# Patient Record
Sex: Male | Born: 1970 | Race: Black or African American | Hispanic: No | Marital: Single | Smoking: Never smoker
Health system: Southern US, Community
[De-identification: ages and names within clinical notes are randomized; demographics above are authoritative.]

## PROBLEM LIST (undated history)

## (undated) DIAGNOSIS — C9590 Leukemia, unspecified not having achieved remission: Secondary | ICD-10-CM

## (undated) DIAGNOSIS — N289 Disorder of kidney and ureter, unspecified: Secondary | ICD-10-CM

## (undated) DIAGNOSIS — N189 Chronic kidney disease, unspecified: Secondary | ICD-10-CM

## (undated) DIAGNOSIS — E119 Type 2 diabetes mellitus without complications: Secondary | ICD-10-CM

## (undated) DIAGNOSIS — I10 Essential (primary) hypertension: Secondary | ICD-10-CM

## (undated) DIAGNOSIS — I2510 Atherosclerotic heart disease of native coronary artery without angina pectoris: Secondary | ICD-10-CM

## (undated) DIAGNOSIS — I222 Subsequent non-ST elevation (NSTEMI) myocardial infarction: Secondary | ICD-10-CM

## (undated) HISTORY — PX: SHOULDER SURGERY: SHX246

## (undated) HISTORY — DX: Chronic kidney disease, unspecified: N18.9

## (undated) HISTORY — DX: Disorder of kidney and ureter, unspecified: N28.9

## (undated) HISTORY — DX: Atherosclerotic heart disease of native coronary artery without angina pectoris: I25.10

## (undated) HISTORY — PX: CARDIAC CATHETERIZATION: SHX172

## (undated) HISTORY — DX: Leukemia, unspecified not having achieved remission: C95.90

## (undated) HISTORY — PX: OTHER SURGICAL HISTORY: SHX169

## (undated) HISTORY — DX: Subsequent non-ST elevation (NSTEMI) myocardial infarction: I22.2

## (undated) HISTORY — DX: Essential (primary) hypertension: I10

## (undated) HISTORY — DX: Type 2 diabetes mellitus without complications: E11.9

---

## 2006-04-25 ENCOUNTER — Ambulatory Visit (HOSPITAL_COMMUNITY): Admission: RE | Admit: 2006-04-25 | Discharge: 2006-04-25 | Payer: Self-pay | Admitting: Family Medicine

## 2006-07-23 ENCOUNTER — Inpatient Hospital Stay (HOSPITAL_COMMUNITY): Admission: EM | Admit: 2006-07-23 | Discharge: 2006-07-24 | Payer: Self-pay | Admitting: Specialist

## 2006-07-23 ENCOUNTER — Other Ambulatory Visit: Payer: Self-pay | Admitting: Specialist

## 2007-05-02 ENCOUNTER — Emergency Department (HOSPITAL_COMMUNITY): Admission: EM | Admit: 2007-05-02 | Discharge: 2007-05-03 | Payer: Self-pay | Admitting: Emergency Medicine
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
## 2010-06-05 NOTE — Op Note (Signed)
NAME:  Dean Stone, Dean Stone NO.:  [id number]   MEDICAL RECORD NO.:  [id number]          PATIENT TYPE:  INP   LOCATION:  1528                         FACILITY:  [facility]   PHYSICIAN:  Jene Every, M.D.    DATE OF BIRTH:  06-10-1970   DATE OF PROCEDURE:  07/23/2006  DATE OF DISCHARGE:                               OPERATIVE REPORT   PREOPERATIVE DIAGNOSIS:  Rotator cuff tear, AC arthrosis and pendulum  syndrome.   POSTOPERATIVE DIAGNOSIS:  Rotator cuff tear, AC arthrosis and pendulum  syndrome.   PROCEDURE:  1. Open rotator cuff repair.  2. Subacromial decompression.  3. Acromioplasty.  4. Distal clavicle resection.   ANESTHESIA:  General.   ASSISTANT:  Roma Schanz, P.A.   INDICATIONS FOR PROCEDURE:  A 40 year old with persistent shoulder pain.  MRI indicating near full-thickness tear of the rotator cuff, AC  arthrosis, and impingement refractory to conservative treatment.  Operative intervention was indicated for repair of the torn tendon and  distal clavicle resection.  Risks and benefits discussed including  infection, suboptimal range of motion, recurrent tear, need for  revision, anesthetic complications etc.   DESCRIPTION OF PROCEDURE:  The patient supine in beach-chair position  after induction of adequate anesthesia, 2 grams Kefzol, the right  shoulder and upper extremity prepped and draped in the usual sterile  fashion.  Incision was made over the anterolateral aspect of the  acromion, cutaneous tissue was dissected.  Electrocautery utilized to  achieve hemostasis, 0.25% Marcaine with epinephrine infiltrated in  subcutaneous tissue. Divided deltotrapezial fascia, incised the capsule  over the distal clavicle, skeletonized the distal clavicle was  subperiosteal elevation, delineated the anterior posterior and inferior  portions of the distal clavicle with a baby Bennett.  Oscillating saw  utilized to remove the distal centimeter of the clavicle  and was found  be severely osteoarthritic.  This was then removed, the spur projecting  inferiorly was removed with 3 mm Kerrison undercutting the inferior  surface of the clavicle.  Clavicle was stable following the resection.  There was ample space between that and acromion. Copiously irrigated  with antibiotic irrigation.  Bone wax placed over the distal clavicle  exposed surface, Gelfoam placed in the defect.  I repaired the capsule  with #1 Vicryl interrupted figure-of-eight sutures.  Next raphe between  the anterolateral heads of the acromion was identified divided 3 cm over  the anterolateral aspect the acromion, subperiosteally elevated the  deltoid insertion off the anterolateral aspect and the anterior medial  aspect of the acromion, protected the tissue, dissected the CA ligament,  performed acromioplasty utilizing oscillating saw and 3 mm Kerrison to a  type 1 acromion.  We found a small accessory full-thickness tear of the  rotator cuff supraspinatus near its insertion.  We then excised and  debrided it, curetted bone beneath it, and repaired side-to-side #1  Vicryl interrupted figure-of-eight suture with excellent closure.  On  exam the remainder of the cuff anteriorly, posteriorly more medially  without additional tear.  Good range of motion without tension on the  repair site.  No impingement. Wound copiously irrigated,  repaired the  Raphe #1 Vicryl interrupted figure-of-eight sutures up into and through  the acromion with excellent repair.  Repaired the deltotrapezial fascia  and subcutaneous tissue with 2-0  Vicryl suture, skin 4-0 Prolene subcuticular wound reinforced Steri-  Strips.  Sterile dressing applied, placed in abduction pillow, extubated  without difficulty and transported to recovery in satisfactory  condition.   The patient tolerated the procedure well with no complications.      Jene Every, M.D.  Electronically Signed     JB/MEDQ  D:   07/23/2006  T:  07/23/2006  Job:  [id number]

## 2010-11-06 LAB — POCT HEMOGLOBIN-HEMACUE
Hemoglobin: 16.1
Operator id: 133231

## 2014-11-25 ENCOUNTER — Encounter: Payer: Self-pay | Admitting: Cardiothoracic Surgery

## 2014-11-25 ENCOUNTER — Encounter: Payer: Self-pay | Admitting: Cardiovascular Disease

## 2014-11-25 ENCOUNTER — Institutional Professional Consult (permissible substitution) (INDEPENDENT_AMBULATORY_CARE_PROVIDER_SITE_OTHER): Payer: Managed Care, Other (non HMO) | Admitting: Cardiothoracic Surgery

## 2014-11-25 ENCOUNTER — Encounter: Payer: Self-pay | Admitting: *Deleted

## 2014-11-25 VITALS — BP 139/89 | HR 87 | Resp 16 | Ht 70.0 in | Wt 260.0 lb

## 2014-11-25 DIAGNOSIS — N189 Chronic kidney disease, unspecified: Secondary | ICD-10-CM

## 2014-11-25 DIAGNOSIS — E1165 Type 2 diabetes mellitus with hyperglycemia: Secondary | ICD-10-CM | POA: Diagnosis not present

## 2014-11-25 DIAGNOSIS — I25119 Atherosclerotic heart disease of native coronary artery with unspecified angina pectoris: Secondary | ICD-10-CM

## 2014-11-25 DIAGNOSIS — IMO0002 Reserved for concepts with insufficient information to code with codable children: Secondary | ICD-10-CM | POA: Insufficient documentation

## 2014-11-25 DIAGNOSIS — E1122 Type 2 diabetes mellitus with diabetic chronic kidney disease: Secondary | ICD-10-CM | POA: Diagnosis not present

## 2014-11-25 DIAGNOSIS — I2510 Atherosclerotic heart disease of native coronary artery without angina pectoris: Secondary | ICD-10-CM | POA: Insufficient documentation

## 2014-11-25 DIAGNOSIS — E1129 Type 2 diabetes mellitus with other diabetic kidney complication: Secondary | ICD-10-CM | POA: Insufficient documentation

## 2014-11-25 MED ORDER — INSULIN GLARGINE 100 UNIT/ML SOLOSTAR PEN: 25.0000 [IU] | PEN_INJECTOR | Freq: Every day | SUBCUTANEOUS | Status: AC

## 2014-11-25 NOTE — Progress Notes (Signed)
PCP is No primary care provider on file. Referring Provider is Ronald Lobo  Chief Complaint  Patient presents with  . Coronary Artery Disease    ECHO and CATH while IP in [place]  patient examined, outside coronary arteriograms from [place] personally reviewed. Echocardiogram images not available but report reviewed.  HPI: The patient is a 44 year old obese diabetic nonsmoker who was admitted to the hospital in [place] with symptoms of heart failure and positive cardiac enzymes. Cardiac catheterization demonstrated moderate-severe three-vessel coronary disease. Baseline creatinine is 1.9 soVgram not performed. Echocardiogram showed EF 45% with mild-moderate MR. The patient developed acute renal failure requiring dialysis after cardiac catheterization. His hemoglobin A1c was 11.5. The patient was recommended to have CABG. The patient preferred to have surgery at home and he returns for surgical evaluation. Is not having anginal symptoms. Unfortunately he was told to stop taking his diabetic medication his blood sugars are running close to 300. His primary care person has gone on maternity leave.  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 He denies recurrent hematologic problems.we'll try to get some old records. Past Medical History  Diagnosis Date  . CAD (coronary artery disease)   . Diabetes ([place])   . Hypertension   . Leukemia ([place])     was treated in his 81's  . Renal insufficiency   . Chronic kidney disease   . Acute non-ST-elevation MI following previous MI [facility])     Past Surgical History   Procedure Laterality Date  . Fracture surgery only    . Cardiac catheterization      11/14/2014-   . Shoulder surgery      FOR BONE SPUR, ARTHRITIS    Family History  Problem Relation Age of Onset  . Stroke Father     Social History Social History  Substance Use Topics  . Smoking status: Never Smoker   . Smokeless tobacco: Never Used  . Alcohol Use: No    Current Outpatient Prescriptions  Medication Sig Dispense Refill  . calcitRIOL (ROCALTROL) 0.25 MCG capsule Take 0.25 mcg by mouth daily.    . carvedilol (COREG) 12.5 MG tablet Take 12.5 mg by mouth 2 (two) times daily with a meal.    . furosemide (LASIX) 40 MG tablet Take 40 mg by mouth 2 (two) times daily.    Marland Kitchen levofloxacin (LEVAQUIN) 250 MG tablet Take 250 mg by mouth daily. X 5 DAYS ENDING 11/26/14    . nitroGLYCERIN (NITROSTAT) 0.4 MG SL tablet Place 0.4 mg under the tongue every 5 (five) minutes as needed for chest pain.     No current facility-administered medications for this visit.    Allergies  Allergen Reactions  . Vancomycin Hives    Review of Systems       The patient denies any penetrating or blunt chest trauma      The patient is right-hand dominant      The patient has mild-moderate varicosities of his left lower leg but no history of DVT       The patient has only 2 teeth left, right maxilla            Review of  Systems :  [ y ] = yes, [  ] = no        General :  Weight gain [   ]    Weight loss  [ yes from fluid loss  ]  Fatigue [  ]  Fever [  ]  Chills  [  ]                                Weakness  [  ]           Cardiac :  Chest pain/ pressure [  ]  Resting SOB [  ] exertional SOB [place]  ]                        Orthopnea [  ]  Pedal edema  [yes improved  ]  Palpitations [  ] Syncope/presyncope [ ]                         Paroxysmal nocturnal dyspnea [  ]        Pulmonary : cough [  ]  wheezing [  ]  Hemoptysis [  ] Sputum [  ] Snoring [  ]                              Pneumothorax [  ]  Sleep  apnea [  ]       GI : Vomiting [  ]  Dysphagia [  ]  Melena  [  ]  Abdominal pain [  ] BRBPR [  ]              Heart burn [  ]  Constipation [  ] Diarrhea  [  ] Colonoscopy [  ]       GU : Hematuria [  ]  Dysuria [  ]  Nocturia [  ] UTI's [  ]       Vascular : Claudication [  ]  Rest pain [  ]  DVT [  ] Vein stripping [  ] leg ulcers [  ]                          TIA [  ] Stroke [  ]  Varicose veins [ mild-moderate left leg involvement ]       NEURO :  Headaches  [  ] Seizures [  ] Vision changes [  ] Paresthesias [  ]       Musculoskeletal :  Arthritis [  ] Gout  [  ]  Back pain [  ]  Joint pain [  ]       Skin :  Rash [  ]  Melanoma [  ]        Heme : Bleeding problems [  ]Clotting Disorders [  ] Anemia [  ]Blood Transfusion [ ]  history of acutemyelo- blastic leukemia at age 45       Endocrine : Diabetes [ yes-A1c 48.5 ] Thyroid Disorder  [  ]       Psych : Depression [  ]  Anxiety [  ]  Psych hospitalizations [  ]  BP 139/89 mmHg  Pulse 87  Resp 16  Ht 5\' 10"  (1.778 m)  Wt 260 lb (117.935 kg)  BMI 37.31 kg/m2  SpO2 98% Physical Exam      Physical Exam  General: obese  AA male in no distress accompanied by wife in the office HEENT: Normocephalic pupils equal , dentition adequate Neck: Supple without JVD, adenopathy, or bruit Chest: Clear to auscultation, symmetrical breath sounds, no rhonchi, no tenderness             or deformity Cardiovascular: Regular rate and rhythm, no murmur, no gallop, peripheral pulses             palpable in all extremities Abdomen:  Obese,Soft, nontender, no palpable mass or organomegaly Extremities: Warm, well-perfused, no clubbing cyanosis edema or tenderness,              no venous stasis changes of the legs. Left leg with mild varicosities Rectal/GU: Deferred Neuro: Grossly non--focal and symmetrical throughout Skin: Clean and dry without rash or ulceration   Diagnostic Tests: The patient has  severe three-vessel coronary disease. He would benefit from left eye may to LAD, vein graft to first diagonal, vein graft to first OM, vein graft to posterior descending  Prior to scheduling surgery the patient needs to be evaluated by renal because he is at high risk for developing acute renal failure and needing to acute dialysis which happen to him after his cardiac catheterization 2 weeks ago.  Patient was told to stop all of his medications for diabetes when he left the hospital in it [place] . His primary care provider is not available. He will need to get his blood sugars under control prior to elective CABG to reduce the perioperative risk of significant infection. He will be started on Lantus 25 units daily until he can established care with a medical physician to control his diabetes.  Impression: Severe three-vessel CAD, EF 45% by echo report he'll need a repeat 2-D echocardiogram to assess his LV function mitral valve disease. Uncontrolled diabetes mellitus-this needs to improve prior to scheduling surgery Chronic renal insufficiency with history recent acute on chronic renal failure requiring dialysis after cardiac catheterization-renal evaluation prior to surgery is pending.  Plan:return in 2 weeks to assess his renal evaluation and assess his diabetic status. We'll need preoperative echocardiogram performed here.   Len Childs, MD [facility] [phone]

## 2014-11-25 NOTE — Progress Notes (Signed)
No show

## 2014-11-28 ENCOUNTER — Encounter: Payer: Self-pay | Admitting: *Deleted

## 2014-11-30 ENCOUNTER — Encounter: Payer: Self-pay | Admitting: *Deleted

## 2014-11-30 ENCOUNTER — Encounter: Payer: Self-pay | Admitting: Cardiovascular Disease

## 2014-12-22 ENCOUNTER — Ambulatory Visit (INDEPENDENT_AMBULATORY_CARE_PROVIDER_SITE_OTHER): Payer: Managed Care, Other (non HMO) | Admitting: Cardiothoracic Surgery

## 2014-12-22 ENCOUNTER — Encounter: Payer: Self-pay | Admitting: Cardiothoracic Surgery

## 2014-12-22 ENCOUNTER — Other Ambulatory Visit: Payer: Self-pay | Admitting: *Deleted

## 2014-12-22 VITALS — BP 118/84 | HR 88 | Resp 20 | Ht 70.0 in | Wt 260.0 lb

## 2014-12-22 DIAGNOSIS — Z01810 Encounter for preprocedural cardiovascular examination: Secondary | ICD-10-CM

## 2014-12-22 DIAGNOSIS — E1165 Type 2 diabetes mellitus with hyperglycemia: Secondary | ICD-10-CM | POA: Diagnosis not present

## 2014-12-22 DIAGNOSIS — E1122 Type 2 diabetes mellitus with diabetic chronic kidney disease: Secondary | ICD-10-CM | POA: Diagnosis not present

## 2014-12-22 DIAGNOSIS — I25119 Atherosclerotic heart disease of native coronary artery with unspecified angina pectoris: Secondary | ICD-10-CM

## 2014-12-22 DIAGNOSIS — N189 Chronic kidney disease, unspecified: Secondary | ICD-10-CM

## 2014-12-23 NOTE — Progress Notes (Signed)
PCP is Tereasa Coop, PA-C Referring Provider is Lahoma Rocker, MD  Chief Complaint  Patient presents with  . Coronary Artery Disease    2 week f/u further discuss surgery s/p renal and endo consults   routine office follow-up further discuss recently diagnosed 3 vessel coronary artery disease after an MI last month while out of town.  HPI: Severe three-vessel coronary disease Morbid obesity Chronic renal insufficiency Poorly controlled diabetes   History of leukemia treated with bone marrow transplant  Patient returns for discussion of his heart disease and to monitor progress in improving his preoperative status regarding diabetes and renal insufficiency. The patient was seen by [facility]. His creatinine was 1.8 with a BUN of 36. Patient is taking Lasix 40 mg daily. Urine protein was 2+. Total protein was 6.0 with albumin of 3.4. They have made recommendations to optimize his preoperative renal status which is now adequate.  The patient's last A1c was 10.7. The patient has established care with a primary physician to help control his diabetic. Last blood sugar on chemistry panel was 175.  Patient's current CBC indicates adequate platelet count 270 K white count 6.9 hematocrit 35%  The patient has not been taking any nitroglycerin for angina.  The patient did not have a preoperative echocardiogram to assess LV function which was not done as part of his cardiac catheterization because of his elevated creatinine. In fact the patient required acute dialysis after his cardiac cath [place]. The patient will need echocardiogram here with images to be reviewed by myself before his scheduled for multivessel CABG.  Past Medical History  Diagnosis Date  . CAD (coronary artery disease)   . Diabetes ([place])   . Hypertension   . Leukemia ([place])     was treated in his 58's  . Renal insufficiency   . Chronic kidney disease   . Acute non-ST-elevation MI  following previous MI [facility])     Past Surgical History  Procedure Laterality Date  . Fracture surgery only    . Cardiac catheterization      11/14/2014-   . Shoulder surgery      FOR BONE SPUR, ARTHRITIS    Family History  Problem Relation Age of Onset  . Stroke Father     Social History Social History  Substance Use Topics  . Smoking status: Never Smoker   . Smokeless tobacco: Never Used  . Alcohol Use: No    Current Outpatient Prescriptions  Medication Sig Dispense Refill  . calcitRIOL (ROCALTROL) 0.25 MCG capsule Take 0.25 mcg by mouth daily.    . carvedilol (COREG) 12.5 MG tablet Take 12.5 mg by mouth 2 (two) times daily with a meal.    . furosemide (LASIX) 40 MG tablet Take 40 mg by mouth daily.     . Insulin Glargine (LANTUS SOLOSTAR) 100 UNIT/ML Solostar Pen Inject 25 Units into the skin daily at 10 pm. 15 mL 6  . nitroGLYCERIN (NITROSTAT) 0.4 MG SL tablet Place 0.4 mg under the tongue every 5 (five) minutes as needed for chest pain.    . pravastatin (PRAVACHOL) 80 MG tablet Take 80 mg by mouth daily.      No current facility-administered medications for this visit.    Allergies  Allergen Reactions  . Bee Venom Shortness Of Breath and Swelling  . Vancomycin Hives    Review of Systems   No fever  no cough or productive sputum No weight gain No angina No ankle  swelling or arthritis No abdominal pain or nausea or vomiting  BP 118/84 mmHg  Pulse 88  Resp 20  Ht 5\' 10"  (1.778 m)  Wt 260 lb (117.935 kg)  BMI 37.31 kg/m2  SpO2 98% Physical Exam     Physical Exam  General: Obese middle-aged AA male no acute distress HEENT: Normocephalic pupils equal , dentition adequate Neck: Supple without JVD, adenopathy, or bruit Chest: Clear to auscultation, symmetrical breath sounds, no rhonchi, no tenderness             or deformity Cardiovascular: Regular rate and rhythm, no murmur, no gallop, peripheral pulses             palpable in all extremities Abdomen:   Soft, obese  nontender, no palpable mass or organomegaly Extremities: Warm, well-perfused, no clubbing cyanosis edema or tenderness,              no venous stasis changes of the legs Rectal/GU: Deferred Neuro: Grossly non--focal and symmetrical throughout Skin: Clean and dry without rash or ulceration   Diagnostic Tests: Laboratory data from renal office visit and reviewed Patient will need echocardiogram performed prior to being scheduled for surgery  Impression: Severe three-vessel coronary disease Moderate LV dysfunction by outside report of echocardiogram, images not available  Plan: Patient will return after he obtains his echocardiogram and at that time multivessel CABG will be scheduled. He will continue his current medications until then. He is encouraged to be compliant with his medications and his diabetic diet.   Len Childs, MD [facility] [phone]

## 2015-01-03 ENCOUNTER — Ambulatory Visit (HOSPITAL_COMMUNITY): Payer: Managed Care, Other (non HMO)

## 2015-01-06 ENCOUNTER — Ambulatory Visit (HOSPITAL_COMMUNITY): Admission: RE | Admit: 2015-01-06 | Payer: Managed Care, Other (non HMO) | Source: Ambulatory Visit

## 2015-01-06 ENCOUNTER — Telehealth (HOSPITAL_COMMUNITY): Payer: Self-pay | Admitting: Cardiothoracic Surgery

## 2015-01-18 ENCOUNTER — Encounter: Payer: Managed Care, Other (non HMO) | Admitting: Cardiothoracic Surgery

## 2015-07-30 ENCOUNTER — Emergency Department (HOSPITAL_COMMUNITY)
Admission: EM | Admit: 2015-07-30 | Discharge: 2015-07-30 | Disposition: A | Discharge status: Home / Self Care | Payer: 59 | Attending: Emergency Medicine | Admitting: Emergency Medicine

## 2015-07-30 ENCOUNTER — Encounter (HOSPITAL_COMMUNITY): Payer: Self-pay | Admitting: Emergency Medicine

## 2015-07-30 DIAGNOSIS — Z79899 Other long term (current) drug therapy: Secondary | ICD-10-CM | POA: Diagnosis not present

## 2015-07-30 DIAGNOSIS — Z7982 Long term (current) use of aspirin: Secondary | ICD-10-CM | POA: Insufficient documentation

## 2015-07-30 DIAGNOSIS — I129 Hypertensive chronic kidney disease with stage 1 through stage 4 chronic kidney disease, or unspecified chronic kidney disease: Secondary | ICD-10-CM | POA: Diagnosis not present

## 2015-07-30 DIAGNOSIS — Y999 Unspecified external cause status: Secondary | ICD-10-CM | POA: Diagnosis not present

## 2015-07-30 DIAGNOSIS — E1122 Type 2 diabetes mellitus with diabetic chronic kidney disease: Secondary | ICD-10-CM | POA: Insufficient documentation

## 2015-07-30 DIAGNOSIS — Y929 Unspecified place or not applicable: Secondary | ICD-10-CM | POA: Diagnosis not present

## 2015-07-30 DIAGNOSIS — S60945A Unspecified superficial injury of left ring finger, initial encounter: Secondary | ICD-10-CM | POA: Diagnosis not present

## 2015-07-30 DIAGNOSIS — W458XXA Other foreign body or object entering through skin, initial encounter: Secondary | ICD-10-CM | POA: Diagnosis not present

## 2015-07-30 DIAGNOSIS — I2510 Atherosclerotic heart disease of native coronary artery without angina pectoris: Secondary | ICD-10-CM | POA: Insufficient documentation

## 2015-07-30 DIAGNOSIS — Y9389 Activity, other specified: Secondary | ICD-10-CM | POA: Insufficient documentation

## 2015-07-30 DIAGNOSIS — Z794 Long term (current) use of insulin: Secondary | ICD-10-CM | POA: Diagnosis not present

## 2015-07-30 DIAGNOSIS — N189 Chronic kidney disease, unspecified: Secondary | ICD-10-CM | POA: Diagnosis not present

## 2015-07-30 DIAGNOSIS — S6992XA Unspecified injury of left wrist, hand and finger(s), initial encounter: Secondary | ICD-10-CM

## 2015-07-30 MED ORDER — POVIDONE-IODINE 10 % EX SOLN
CUTANEOUS | Status: AC
  Filled 2015-07-30: qty 118

## 2015-07-30 MED ORDER — LIDOCAINE HCL (PF) 2 % IJ SOLN
10.0000 mL | Freq: Once | INTRAMUSCULAR | Status: AC
  Administered 2015-07-30: 10 mL
  Filled 2015-07-30 (×2): qty 10

## 2015-07-30 NOTE — Discharge Instructions (Signed)
Puncture Wound A puncture wound is an injury that is caused by a sharp, thin object that goes through (penetrates) your skin. Usually, a puncture wound does not leave a large opening in your skin, so it may not bleed a lot. However, when you get a puncture wound, dirt or other materials (foreign bodies) can be forced into your wound and break off inside. This increases the chance of infection, such as tetanus. CAUSES Puncture wounds are caused by any sharp, thin object that goes through your skin, such as:  Animal teeth, as with an animal bite.  Sharp, pointed objects, such as nails, splinters of glass, fishhooks, and needles. SYMPTOMS Symptoms of a puncture wound include:  Pain.  Bleeding.  Swelling.  Bruising.  Fluid leaking from the wound.  Numbness, tingling, or loss of function. DIAGNOSIS This condition is diagnosed with a medical history and physical exam. Your wound will be checked to see if it contains any foreign bodies. You may also have X-rays or other imaging tests. TREATMENT Treatment for a puncture wound depends on how serious the wound is. It also depends on whether the wound contains any foreign bodies. Treatment for all types of puncture wounds usually starts with:  Controlling the bleeding.  Washing out the wound with a germ-free (sterile) salt-water solution.  Checking the wound for foreign bodies. Treatment may also include:  Having the wound opened surgically to remove a foreign object.  Closing the wound with stitches (sutures) if it continues to bleed.  Covering the wound with antibiotic ointments and a bandage (dressing).  Receiving a tetanus shot.  Receiving a rabies vaccine. HOME CARE INSTRUCTIONS Medicines  Take or apply over-the-counter and prescription medicines only as told by your health care provider.  If you were prescribed an antibiotic, take or apply it as told by your health care provider. Do not stop using the antibiotic even if  your condition improves. Wound Care  There are many ways to close and cover a wound. For example, a wound can be covered with sutures, skin glue, or adhesive strips. Follow instructions from your health care provider about:  How to take care of your wound.  When and how you should change your dressing.  When you should remove your dressing.  Removing whatever was used to close your wound.  Keep the dressing dry as told by your health care provider. Do not take baths, swim, use a hot tub, or do anything that would put your wound underwater until your health care provider approves.  Clean the wound as told by your health care provider.  Do not scratch or pick at the wound.  Check your wound every day for signs of infection. Watch for:  Redness, swelling, or pain.  Fluid, blood, or pus. General Instructions  Raise (elevate) the injured area above the level of your heart while you are sitting or lying down.  If your puncture wound is in your foot, ask your health care provider if you need to avoid putting weight on your foot and for how long.  Keep all follow-up visits as told by your health care provider. This is important. SEEK MEDICAL CARE IF:  You received a tetanus shot and you have swelling, severe pain, redness, or bleeding at the injection site.  You have a fever.  Your sutures come out.  You notice a bad smell coming from your wound or your dressing.  You notice something coming out of your wound, such as wood or glass.  Your   pain is not controlled with medicine.  You have increased redness, swelling, or pain at the site of your wound.  You have fluid, blood, or pus coming from your wound.  You notice a change in the color of your skin near your wound.  You need to change the dressing frequently due to fluid, blood, or pus draining from your wound.  You develop a new rash.  You develop numbness around your wound. SEEK IMMEDIATE MEDICAL CARE IF:  You  develop severe swelling around your wound.  Your pain suddenly increases and is severe.  You develop painful skin lumps.  You have a red streak going away from your wound.  The wound is on your hand or foot and you cannot properly move a finger or toe.  The wound is on your hand or foot and you notice that your fingers or toes look pale or bluish.   This information is not intended to replace advice given to you by your health care provider. Make sure you discuss any questions you have with your health care provider.   Document Released: 10/17/2004 Document Revised: 09/28/2014 Document Reviewed: 03/02/2014 [employer] Interactive Patient Education 2016 [employer]  

## 2015-07-30 NOTE — ED Provider Notes (Signed)
CSN: [id number]     Arrival date & time 07/30/15  1244 History  By signing my name below, I, Eustaquio Maize, attest that this documentation has been prepared under the direction and in the presence of Alyse Low, [place].  Electronically Signed: Eustaquio Maize, ED Scribe. 07/30/2015. 3:07 PM.   Chief Complaint  Patient presents with  . Foreign Body in Skin   Patient is a 45 y.o. male presenting with foreign body. The history is provided by the patient. No language interpreter was used.  Foreign Body Location:  Skin Suspected object: fish hook. Pain quality:  Dull Pain severity:  Mild Duration:  2 hours Timing:  Constant Progression:  Unchanged Chronicity:  New Worsened by:  Nothing tried Ineffective treatments:  None tried   HPI Comments: Dean Stone is a 45 y.o. male with PMHx CAD, DM, HTN, and CKD who presents to the Emergency Department complaining of fish hook in left ring finger that occurred earlier today. Pt reports that he was fishing when the hook became lodged in his finger. He notes mild pain to the area. No active bleeding. Tetanus is up to date. Denies weakness, numbness, tingling, or any other associated symptoms.   Past Medical History  Diagnosis Date  . CAD (coronary artery disease)   . Diabetes ([place])   . Hypertension   . 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 Renal insufficiency   . Chronic kidney disease   . Acute non-ST-elevation MI following previous MI [facility])    Past Surgical History  Procedure Laterality Date  . Fracture surgery only    . Cardiac catheterization      11/14/2014-   . Shoulder surgery      FOR BONE SPUR, ARTHRITIS   Family History  Problem Relation Age of Onset  . Stroke Father    Social History  Substance Use Topics  . Smoking status: Never Smoker   . Smokeless tobacco: Never Used  . Alcohol Use: No    Review of Systems  Musculoskeletal: Positive for arthralgias.  Skin: Positive for wound.  Neurological: Negative  for weakness and numbness.  All other systems reviewed and are negative.  Allergies  Bee venom and Vancomycin  Home Medications   Prior to Admission medications   Medication Sig Start Date End Date Taking? Authorizing Provider  aspirin EC 81 MG tablet Take 162 mg by mouth daily.   Yes Historical Provider, MD  calcitRIOL (ROCALTROL) 0.25 MCG capsule Take 0.25 mcg by mouth daily.   Yes Historical Provider, MD  carvedilol (COREG) 12.5 MG tablet Take 12.5 mg by mouth 2 (two) times daily with a meal.   Yes Historical Provider, MD  furosemide (LASIX) 40 MG tablet Take 40 mg by mouth daily.    Yes Historical Provider, MD  Insulin Glargine (LANTUS SOLOSTAR) 100 UNIT/ML Solostar Pen Inject 25 Units into the skin daily at 10 pm. 11/25/14  Yes Ivin Poot, MD  nitroGLYCERIN (NITROSTAT) 0.4 MG SL tablet Place 0.4 mg under the tongue every 5 (five) minutes as needed for chest pain.   Yes Historical Provider, MD  pravastatin (PRAVACHOL) 80 MG tablet Take 80 mg by mouth daily.  12/08/14 12/08/15 Yes Historical Provider, MD   BP 141/81 mmHg  Pulse 77  Temp(Src) 98.2 F (36.8 C) (Oral)  Resp 16  Ht 6' (1.829 m)  Wt 273 lb (123.832 kg)  BMI 37.02 kg/m2  SpO2 100%   Physical Exam  Constitutional: He is oriented  to person, place, and time. He appears well-developed and well-nourished. No distress.  HENT:  Head: Normocephalic and atraumatic.  Eyes: Conjunctivae and EOM are normal.  Neck: Neck supple. No tracheal deviation present.  Cardiovascular: Normal rate.   Pulmonary/Chest: Effort normal. No respiratory distress.  Musculoskeletal: Normal range of motion.  Fish hook in distal tip of left ring finger  Neurological: He is alert and oriented to person, place, and time.  Skin: Skin is warm and dry.  Psychiatric: He has a normal mood and affect. His behavior is normal.  Nursing note and vitals reviewed.   ED Course  Procedures (including critical care time)   DIAGNOSTIC STUDIES: Oxygen  Saturation is 100% on RA, normal by my interpretation.    COORDINATION OF CARE: 2:54 PM-Discussed treatment plan which includes FB Removal with pt at bedside and pt agreed to plan.   Labs Review Labs Reviewed - No data to display  Imaging Review No results found. I have personally reviewed and evaluated these images and lab results as part of my medical decision-making.   EKG Interpretation None      MDM Procedure local xylocaine Fish hook barb release with string No complications   Final diagnoses:  Fish hook injury of finger of left hand, initial encounter    An After Visit Summary was printed and given to the patient.    Fransico Meadow, PA-C 07/30/15 [place], PA-C 07/30/15 [place], MD 08/02/15 1759

## 2015-07-30 NOTE — ED Notes (Signed)
Pt's left ring finger soaked in betadine and sterile water solution per PA.

## 2015-07-30 NOTE — ED Notes (Signed)
Patient has fish hoof in left ring finger. Per patient up-to-date on tetanus. No active bleeding noted.

## 2015-11-06 ENCOUNTER — Ambulatory Visit: Payer: Self-pay | Admitting: Podiatry

## 2016-09-30 ENCOUNTER — Encounter (INDEPENDENT_AMBULATORY_CARE_PROVIDER_SITE_OTHER): Payer: 59 | Admitting: Ophthalmology

## 2016-10-07 ENCOUNTER — Encounter (INDEPENDENT_AMBULATORY_CARE_PROVIDER_SITE_OTHER): Payer: 59 | Admitting: Ophthalmology
